# Patient Record
Sex: Male | Born: 1975 | Race: Black or African American | Hispanic: No | Marital: Single | Smoking: Current some day smoker
Health system: Southern US, Community
[De-identification: ages and names within clinical notes are randomized; demographics above are authoritative.]

---

## 1999-12-30 ENCOUNTER — Emergency Department (HOSPITAL_COMMUNITY): Admission: EM | Admit: 1999-12-30 | Discharge: 1999-12-30 | Payer: Self-pay | Admitting: Emergency Medicine

## 2003-08-25 ENCOUNTER — Emergency Department (HOSPITAL_COMMUNITY): Admission: EM | Admit: 2003-08-25 | Discharge: 2003-08-25 | Payer: Self-pay | Admitting: Emergency Medicine

## 2004-05-12 ENCOUNTER — Encounter: Admission: RE | Admit: 2004-05-12 | Discharge: 2004-05-12 | Payer: Self-pay | Admitting: Internal Medicine

## 2005-10-03 IMAGING — CR DG CHEST 2V
2 series · 2 of 2 positions shown · non-contrast
Comparison: none

CLINICAL DATA: Left anterior chest pain.  No known injury.
 CHEST, TWO VIEWS 

  The heart size and mediastinal contours are normal. The lungs are clear. The visualized skeleton is unremarkable.
 IMPRESSION
 No active disease.

[view not recorded (1 of 2)]
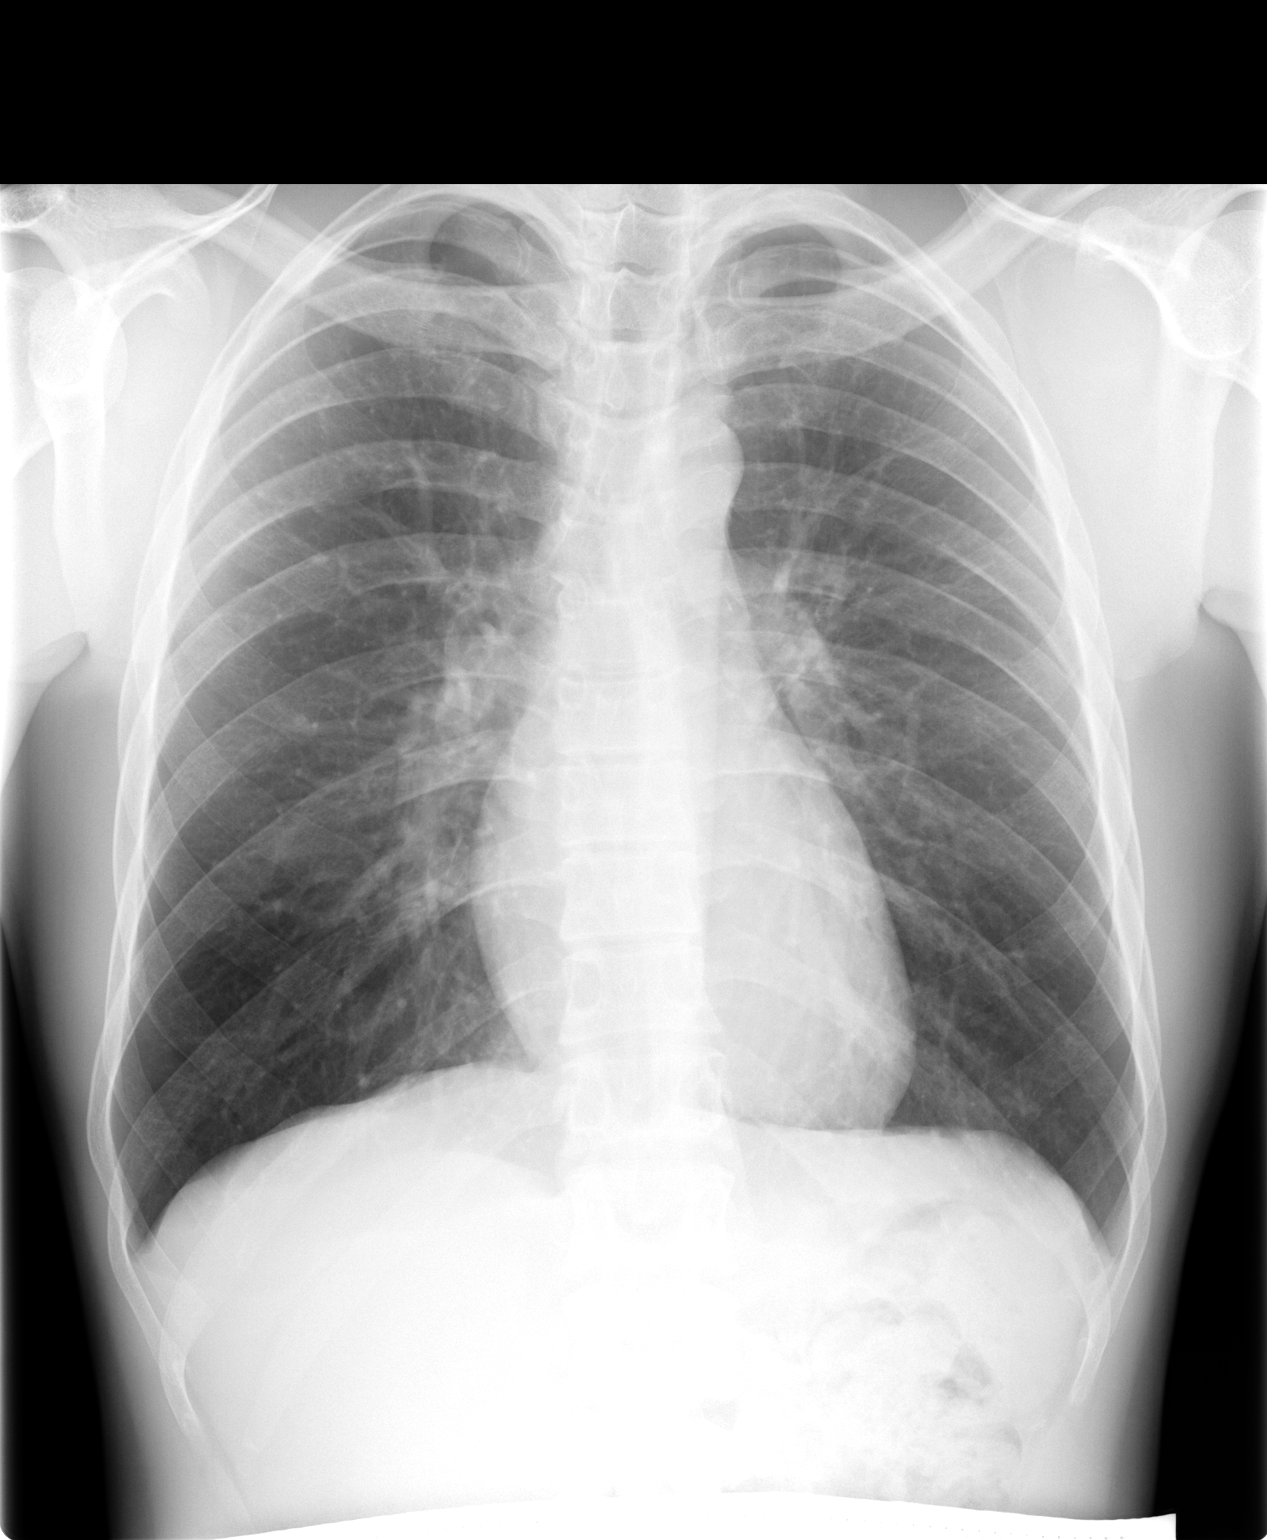

[view not recorded (2 of 2)]
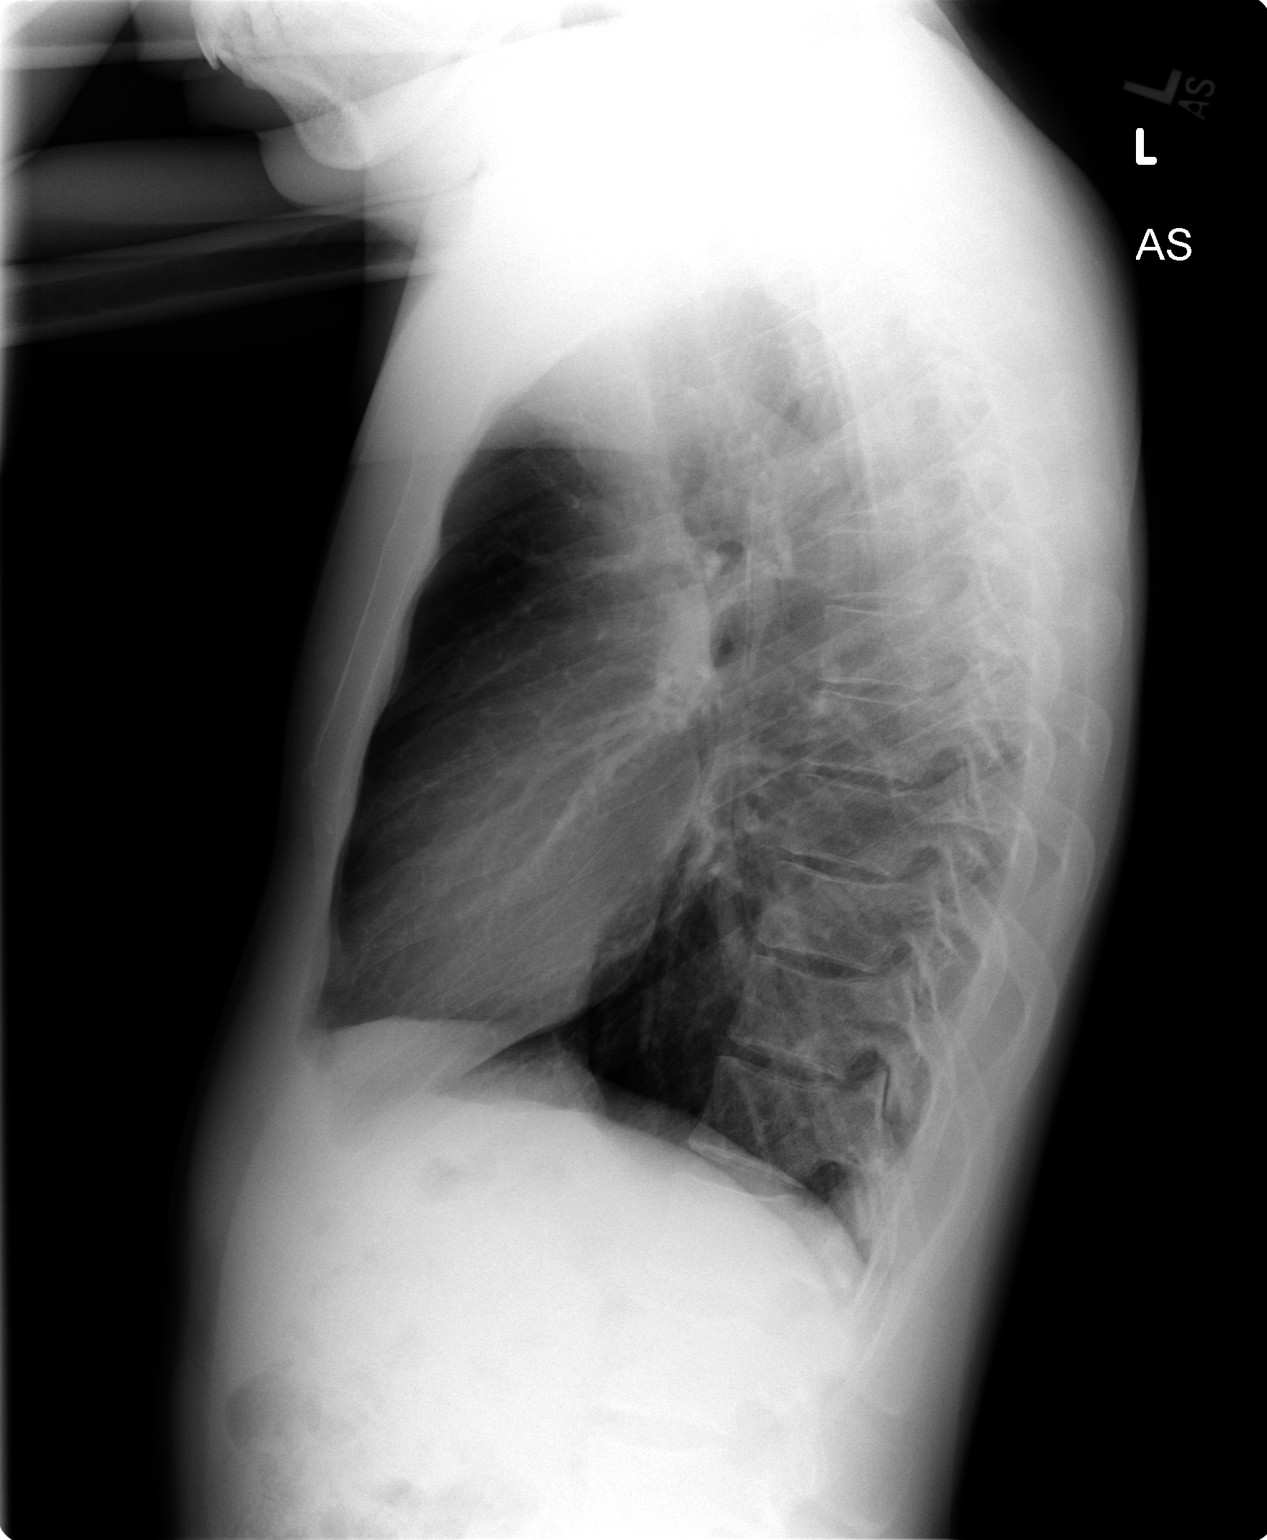

[2 of 2 positions shown; findings below may reference images not displayed]

## 2022-04-21 DIAGNOSIS — Z0000 Encounter for general adult medical examination without abnormal findings: Secondary | ICD-10-CM | POA: Diagnosis not present

## 2022-04-21 DIAGNOSIS — E782 Mixed hyperlipidemia: Secondary | ICD-10-CM | POA: Diagnosis not present

## 2022-04-21 DIAGNOSIS — D709 Neutropenia, unspecified: Secondary | ICD-10-CM | POA: Diagnosis not present

## 2022-04-21 DIAGNOSIS — E559 Vitamin D deficiency, unspecified: Secondary | ICD-10-CM | POA: Diagnosis not present

## 2022-04-21 DIAGNOSIS — D649 Anemia, unspecified: Secondary | ICD-10-CM | POA: Diagnosis not present

## 2022-10-12 ENCOUNTER — Ambulatory Visit (HOSPITAL_COMMUNITY)
Admission: EM | Admit: 2022-10-12 | Discharge: 2022-10-12 | Disposition: A | Discharge status: Home / Self Care | Payer: BC Managed Care – PPO | Attending: Internal Medicine | Admitting: Internal Medicine

## 2022-10-12 ENCOUNTER — Encounter (HOSPITAL_COMMUNITY): Payer: Self-pay | Admitting: *Deleted

## 2022-10-12 DIAGNOSIS — M7918 Myalgia, other site: Secondary | ICD-10-CM | POA: Diagnosis not present

## 2022-10-12 MED ORDER — IBUPROFEN 600 MG PO TABS: 600.0000 mg | ORAL_TABLET | Freq: Four times a day (QID) | ORAL | 0 refills | Status: AC | PRN

## 2022-10-12 MED ORDER — METHOCARBAMOL 500 MG PO TABS: 500.0000 mg | ORAL_TABLET | Freq: Every evening | ORAL | 0 refills | Status: AC | PRN
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
## 2022-10-12 NOTE — Discharge Instructions (Addendum)
This take medications as prescribed Heating pad use only 20 minutes on-20 minutes off cycle will help with the pain Gentle stretching exercises as pain improves Return to urgent care if you have any other concerns No indication for imaging at this time
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
## 2023-06-04 DIAGNOSIS — E782 Mixed hyperlipidemia: Secondary | ICD-10-CM | POA: Diagnosis not present

## 2023-06-04 DIAGNOSIS — Z0000 Encounter for general adult medical examination without abnormal findings: Secondary | ICD-10-CM | POA: Diagnosis not present

## 2023-06-04 DIAGNOSIS — Z13220 Encounter for screening for lipoid disorders: Secondary | ICD-10-CM | POA: Diagnosis not present

## 2023-06-04 DIAGNOSIS — D709 Neutropenia, unspecified: Secondary | ICD-10-CM | POA: Diagnosis not present

## 2023-06-04 DIAGNOSIS — E559 Vitamin D deficiency, unspecified: Secondary | ICD-10-CM | POA: Diagnosis not present

## 2023-10-25 DIAGNOSIS — M79604 Pain in right leg: Secondary | ICD-10-CM | POA: Diagnosis not present

## 2024-06-20 DIAGNOSIS — D709 Neutropenia, unspecified: Secondary | ICD-10-CM | POA: Diagnosis not present

## 2024-06-20 DIAGNOSIS — Z1211 Encounter for screening for malignant neoplasm of colon: Secondary | ICD-10-CM | POA: Diagnosis not present

## 2024-06-20 DIAGNOSIS — E559 Vitamin D deficiency, unspecified: Secondary | ICD-10-CM | POA: Diagnosis not present

## 2024-06-20 DIAGNOSIS — Z125 Encounter for screening for malignant neoplasm of prostate: Secondary | ICD-10-CM | POA: Diagnosis not present

## 2024-06-20 DIAGNOSIS — E782 Mixed hyperlipidemia: Secondary | ICD-10-CM | POA: Diagnosis not present

## 2024-06-20 DIAGNOSIS — Z0000 Encounter for general adult medical examination without abnormal findings: Secondary | ICD-10-CM | POA: Diagnosis not present

## 2024-06-20 DIAGNOSIS — Z6823 Body mass index (BMI) 23.0-23.9, adult: Secondary | ICD-10-CM | POA: Diagnosis not present

## 2024-06-20 DIAGNOSIS — D649 Anemia, unspecified: Secondary | ICD-10-CM | POA: Diagnosis not present

## 2024-07-10 DIAGNOSIS — K5140 Inflammatory polyps of colon without complications: Secondary | ICD-10-CM | POA: Diagnosis not present

## 2024-07-10 DIAGNOSIS — Z1211 Encounter for screening for malignant neoplasm of colon: Secondary | ICD-10-CM | POA: Diagnosis not present
